# Patient Record
Sex: Male | Born: 1995 | Race: Black or African American | Hispanic: No | Marital: Single | Smoking: Current some day smoker
Health system: Southern US, Community
[De-identification: ages and names within clinical notes are randomized; demographics above are authoritative.]

---
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
## 2013-07-20 ENCOUNTER — Emergency Department (HOSPITAL_COMMUNITY)
Admission: EM | Admit: 2013-07-20 | Discharge: 2013-07-20 | Discharge status: Absent without leave | Payer: Self-pay | Source: Home / Self Care

## 2017-07-10 ENCOUNTER — Other Ambulatory Visit: Payer: Self-pay

## 2017-07-10 ENCOUNTER — Emergency Department (HOSPITAL_COMMUNITY)
Admission: EM | Admit: 2017-07-10 | Discharge: 2017-07-10 | Disposition: A | Discharge status: Home / Self Care | Payer: Self-pay | Attending: Emergency Medicine | Admitting: Emergency Medicine

## 2017-07-10 ENCOUNTER — Emergency Department (HOSPITAL_COMMUNITY): Payer: Self-pay

## 2017-07-10 ENCOUNTER — Encounter (HOSPITAL_COMMUNITY): Payer: Self-pay | Admitting: Emergency Medicine

## 2017-07-10 DIAGNOSIS — R070 Pain in throat: Secondary | ICD-10-CM | POA: Insufficient documentation

## 2017-07-10 DIAGNOSIS — R0981 Nasal congestion: Secondary | ICD-10-CM | POA: Insufficient documentation

## 2017-07-10 DIAGNOSIS — F17228 Nicotine dependence, chewing tobacco, with other nicotine-induced disorders: Secondary | ICD-10-CM | POA: Insufficient documentation

## 2017-07-10 DIAGNOSIS — F17200 Nicotine dependence, unspecified, uncomplicated: Secondary | ICD-10-CM | POA: Insufficient documentation

## 2017-07-10 DIAGNOSIS — J069 Acute upper respiratory infection, unspecified: Secondary | ICD-10-CM | POA: Insufficient documentation

## 2017-07-10 DIAGNOSIS — R0602 Shortness of breath: Secondary | ICD-10-CM | POA: Insufficient documentation

## 2017-07-10 DIAGNOSIS — F1210 Cannabis abuse, uncomplicated: Secondary | ICD-10-CM | POA: Insufficient documentation

## 2017-07-10 MED ORDER — AZITHROMYCIN 250 MG PO TABS: 250.0000 mg | ORAL_TABLET | Freq: Every day | ORAL | 0 refills | Status: AC

## 2017-07-10 MED ORDER — LORATADINE 10 MG PO TABS: 10.0000 mg | ORAL_TABLET | Freq: Every day | ORAL | 0 refills | Status: AC

## 2017-07-10 NOTE — ED Provider Notes (Signed)
[facility] EMERGENCY DEPARTMENT Provider Note   CSN: [id number] Arrival date & time: 07/10/17  1355     History   Chief Complaint Chief Complaint  Patient presents with  . Cough  . Shortness of Breath  . Nasal Congestion    HPI Norman Bennett is a 22 y.o. male presenting for evaluation of cough, sore throat, nasal congestion.  Patient states for the past 3 weeks, he has had URI symptoms.  It began with a dry cough, he then developed nasal congestion, and currently has a sore throat.  His cough is now productive with phlegm.  He reports chills last night, no fevers.  He has been taking over-the-counter cold and flu medicine without improvement of symptoms.  He smokes cigarettes and smokes marijuana, but has decreased due to his symptoms.  He denies diagnosed history of asthma.  Multiple sick family members at home including child with AOM and mom with walking pneumonia.  He has no medical problems, does not take medications daily.  He denies ear pain, eye pain, difficulty breathing, chest pain, shortness of breath, nausea, vomiting, abdominal pain, urinary symptoms, abnormal bowel movements.  HPI  History reviewed. No pertinent past medical history.  There are no active problems to display for this patient.   History reviewed. No pertinent surgical history.      Home Medications    Prior to Admission medications   Medication Sig Start Date End Date Taking? Authorizing Provider  azithromycin (ZITHROMAX) 250 MG tablet Take 1 tablet (250 mg total) by mouth daily. Take first 2 tablets together, then 1 every day until finished. 07/10/17   Verlisa Vara, PA-C  loratadine (CLARITIN) 10 MG tablet Take 1 tablet (10 mg total) by mouth daily. 07/10/17   Garnett Nunziata, PA-C    Family History No family history on file.  Social History Social History   Tobacco Use  . Smoking status: Current Every Day Smoker  . Smokeless tobacco: Current User  Substance  Use Topics  . Alcohol use: Yes  . Drug use: Yes    Types: Marijuana     Allergies   Patient has no allergy information on record.   Review of Systems Review of Systems  Constitutional: Negative for fever.  HENT: Positive for congestion.   Respiratory: Positive for cough. Negative for shortness of breath.   Cardiovascular: Negative for chest pain.     Physical Exam Updated Vital Signs BP 116/62 (BP Location: Right Arm)   Pulse 95   Temp 98.2 F (36.8 C) (Oral)   Resp 18   Ht  (1.727 m)   Wt 59 kg (130 lb)   SpO2 100%   BMI 19.77 kg/m   Physical Exam  Constitutional: He is oriented to person, place, and time. He appears well-developed and well-nourished. No distress.  Resting comfortably in bed in no apparent distress  HENT:  Head: Normocephalic and atraumatic.  Right Ear: Tympanic membrane, external ear and ear canal normal.  Left Ear: Tympanic membrane, external ear and ear canal normal.  Nose: Mucosal edema present. Right sinus exhibits no maxillary sinus tenderness and no frontal sinus tenderness. Left sinus exhibits no maxillary sinus tenderness and no frontal sinus tenderness.  Mouth/Throat: Uvula is midline, oropharynx is clear and moist and mucous membranes are normal. No tonsillar exudate.  OP clear without tonsillar swelling or exudate.  Uvula midline with equal palate rise.  TMs nonerythematous bilaterally.  Nasal mucosal edema.  Eyes: Pupils are equal, round, and reactive  to light. Conjunctivae and EOM are normal.  Neck: Normal range of motion.  Cardiovascular: Normal rate, regular rhythm and intact distal pulses.  Pulmonary/Chest: Effort normal and breath sounds normal. He has no decreased breath sounds. He has no wheezes. He has no rhonchi. He has no rales.  Pt speaking in full sentences without difficulty.  Clear lung sounds in all fields  Abdominal: Soft. He exhibits no distension. There is no tenderness.  Musculoskeletal: Normal range of motion.    Lymphadenopathy:    He has no cervical adenopathy.  Neurological: He is alert and oriented to person, place, and time.  Skin: Skin is warm.  Psychiatric: He has a normal mood and affect.  Nursing note and vitals reviewed.    ED Treatments / Results  Labs (all labs ordered are listed, but only abnormal results are displayed) Labs Reviewed - No data to display  EKG None  Radiology Dg Chest 2 View  Result Date: 07/10/2017 CLINICAL DATA:  Cough, congestion and shortness of breath for 3 weeks. EXAM: CHEST - 2 VIEW COMPARISON:  None. FINDINGS: Cardiomediastinal silhouette is normal. Mediastinal contours appear intact. There is no evidence of focal airspace consolidation, pleural effusion or pneumothorax. Osseous structures are without acute abnormality. Soft tissues are grossly normal. IMPRESSION: No active cardiopulmonary disease. Electronically Signed   By: Ted Mcalpine M.D.   On: 07/10/2017 17:56    Procedures Procedures (including critical care time)  Medications Ordered in ED Medications - No data to display   Initial Impression / Assessment and Plan / ED Course  I have reviewed the triage vital signs and the nursing notes.  Pertinent labs & imaging results that were available during my care of the patient were reviewed by me and considered in my medical decision making (see chart for details).     Patient presenting with 3 wk h/o URI symptoms.  Physical exam reassuring, patient is afebrile and appears nontoxic.  Pulmonary exam reassuring.  Doubt pneumonia, strep, other bacterial infection, or peritonsillar abscess. However, tp with 3 wks of sxs and contact with walking PNA, will obtain cxr.   CXR viewed and interpreted by me, no PNA, PNX, or effusions.  Likely URI.  Will treat symptomatically and with abx due to duration.  Patient to follow-up as needed.  At this time, patient appears safe for discharge.  Return precautions given.  Patient states he understands and  agrees to plan.   Final Clinical Impressions(s) / ED Diagnoses   Final diagnoses:  Upper respiratory tract infection, unspecified type    ED Discharge Orders        Ordered    azithromycin (ZITHROMAX) 250 MG tablet  Daily     07/10/17 1819    loratadine (CLARITIN) 10 MG tablet  Daily     07/10/17 1819       Alveria Apley, PA-C 07/10/17 1833    Rolland Porter, MD 07/21/17 1151

## 2017-07-10 NOTE — Discharge Instructions (Addendum)
Take azithromycin (antibiotic) as prescribed. Take the entire course, even if your symptoms improve.  Use loratadine daily. Use Tylenol or ibuprofen as needed for fevers or body aches. Use Flonase daily for nasal congestion and cough. Make sure you stay well-hydrated with water. Wash your hands frequently to prevent spread of infection. Return to the emergency room if you develop persistent high fevers, difficulty breathing/swallowing, or any new or worsening symptoms.

## 2017-07-10 NOTE — ED Triage Notes (Signed)
Pt. Stated, Norman Bennett had a cough, congested and SOB since May 9.

## 2018-05-18 DIAGNOSIS — R369 Urethral discharge, unspecified: Secondary | ICD-10-CM | POA: Insufficient documentation

## 2018-05-18 DIAGNOSIS — F17210 Nicotine dependence, cigarettes, uncomplicated: Secondary | ICD-10-CM | POA: Insufficient documentation

## 2018-05-18 DIAGNOSIS — Z79899 Other long term (current) drug therapy: Secondary | ICD-10-CM | POA: Insufficient documentation

## 2018-05-19 ENCOUNTER — Emergency Department (HOSPITAL_COMMUNITY)
Admission: EM | Admit: 2018-05-19 | Discharge: 2018-05-19 | Disposition: A | Discharge status: Home / Self Care | Payer: Medicaid Other | Attending: Emergency Medicine | Admitting: Emergency Medicine

## 2018-05-19 ENCOUNTER — Encounter (HOSPITAL_COMMUNITY): Payer: Self-pay

## 2018-05-19 DIAGNOSIS — R369 Urethral discharge, unspecified: Secondary | ICD-10-CM

## 2018-05-19 LAB — GC/CHLAMYDIA PROBE AMP (~~LOC~~) NOT AT ARMC
Chlamydia: NEGATIVE
Neisseria Gonorrhea: POSITIVE — AB

## 2018-05-19 MED ORDER — STERILE WATER FOR INJECTION IJ SOLN
INTRAMUSCULAR | Status: AC
  Administered 2018-05-19: 01:00:00 1 mL
  Filled 2018-05-19: qty 10

## 2018-05-19 MED ORDER — CEFTRIAXONE SODIUM 250 MG IJ SOLR
250.0000 mg | Freq: Once | INTRAMUSCULAR | Status: AC
  Administered 2018-05-19: 250 mg via INTRAMUSCULAR
  Filled 2018-05-19: qty 250

## 2018-05-19 MED ORDER — AZITHROMYCIN 250 MG PO TABS
1000.0000 mg | ORAL_TABLET | Freq: Once | ORAL | Status: AC
  Administered 2018-05-19: 1000 mg via ORAL
  Filled 2018-05-19: qty 4

## 2018-05-19 NOTE — Discharge Instructions (Signed)
You will be notified of culture results in the next 48-72 hours.   Should remain abstinent for at least a week, partners should be treated as well.   Can follow-up at the health dept.  Return here for any new/acute changes.

## 2018-05-19 NOTE — ED Triage Notes (Signed)
Pt reports green penile discharge starting earlier today. Denies pain.

## 2018-05-19 NOTE — ED Provider Notes (Signed)
[facility]   CSN: [id number] Arrival date & time: 05/18/18  2355    History   Chief Complaint Chief Complaint  Patient presents with  . Penile Discharge    HPI Norman Bennett is a 23 y.o. male.     The history is provided by medical records and the patient.  Penile Discharge     23 year old male here with penile discharge.  Just noticed this today.  Discharge is yellow in color.  States he did notify his sexual partner but they report they are not having any symptoms.  Does have history of STD in the past with similar symptoms.  Has known dysuria, hematuria, fever, chills, or rash.  No genital lesions.  History reviewed. No pertinent past medical history.  There are no active problems to display for this patient.   History reviewed. No pertinent surgical history.      Home Medications    Prior to Admission medications   Medication Sig Start Date End Date Taking? Authorizing Provider  azithromycin (ZITHROMAX) 250 MG tablet Take 1 tablet (250 mg total) by mouth daily. Take first 2 tablets together, then 1 every day until finished. 07/10/17   Caccavale, Sophia, PA-C  loratadine (CLARITIN) 10 MG tablet Take 1 tablet (10 mg total) by mouth daily. 07/10/17   Caccavale, Sophia, PA-C    Family History History reviewed. No pertinent family history.  Social History Social History   Tobacco Use  . Smoking status: Current Every Day Smoker  . Smokeless tobacco: Current User  Substance Use Topics  . Alcohol use: Yes  . Drug use: Yes    Types: Marijuana     Allergies   Patient has no known allergies.   Review of Systems Review of Systems  Genitourinary: Positive for discharge.  All other systems reviewed and are negative.    Physical Exam Updated Vital Signs BP 120/68 (BP Location: Right Arm)   Pulse 68   Temp 98.8 F (37.1 C) (Oral)   Resp 16   Ht 5\' 9"  (1.753 m)   Wt 59 kg   SpO2 99%   BMI 19.20  kg/m   Physical Exam Vitals signs and nursing note reviewed.  Constitutional:      Appearance: He is well-developed.  HENT:     Head: Normocephalic and atraumatic.  Eyes:     Conjunctiva/sclera: Conjunctivae normal.     Pupils: Pupils are equal, round, and reactive to light.  Neck:     Musculoskeletal: Normal range of motion.  Cardiovascular:     Rate and Rhythm: Normal rate and regular rhythm.     Heart sounds: Normal heart sounds.  Pulmonary:     Effort: Pulmonary effort is normal.     Breath sounds: Normal breath sounds.  Abdominal:     General: Bowel sounds are normal.     Palpations: Abdomen is soft.  Genitourinary:    Penis: Circumcised. Discharge present.      Comments: Small amount of thin, watery yellow discharge at urethral meatus, GU exam otherwise normal Musculoskeletal: Normal range of motion.  Skin:    General: Skin is warm and dry.  Neurological:     Mental Status: He is alert and oriented to person, place, and time.      ED Treatments / Results  Labs (all labs ordered are listed, but only abnormal results are displayed) Labs Reviewed  GC/CHLAMYDIA PROBE AMP ([place]) NOT AT [facility]    EKG None  Radiology  No results found.  Procedures Procedures (including critical care time)  Medications Ordered in ED Medications  sterile water (preservative free) injection (has no administration in time range)  azithromycin (ZITHROMAX) tablet 1,000 mg (1,000 mg Oral Given 05/19/18 0048)  cefTRIAXone (ROCEPHIN) injection 250 mg (250 mg Intramuscular Given 05/19/18 0048)     Initial Impression / Assessment and Plan / ED Course  I have reviewed the triage vital signs and the nursing notes.  Pertinent labs & imaging results that were available during my care of the patient were reviewed by me and considered in my medical decision making (see chart for details).  23 y.o. M here with penile discharge.  Hx of STD in the past.  No dysuria, hematuria, abdominal  pain, flank pain, genital lesions, or rash.  Does have some discharged noted on exam at urethral meatus.  Gc/chl pending.  Given rocephin/azithromycin here.  He will be notified of culture results in the next 48-72 hours.  Should remain abstinent for at least a week, partners should be treated as well.  Can follow-up at the health dept.  Return here for new concerns.  Final Clinical Impressions(s) / ED Diagnoses   Final diagnoses:  Penile discharge    ED Discharge Orders    None       Garlon Hatchet, PA-C 05/19/18 0056    Paula Libra, MD 05/19/18 [phone]

## 2018-10-03 IMAGING — CR DG CHEST 2V
2 series · 2 of 2 positions shown · non-contrast
Comparison: None.

CLINICAL DATA: Cough, congestion and shortness of breath for 3
weeks.

EXAM:
CHEST - 2 VIEW

[chest pa]
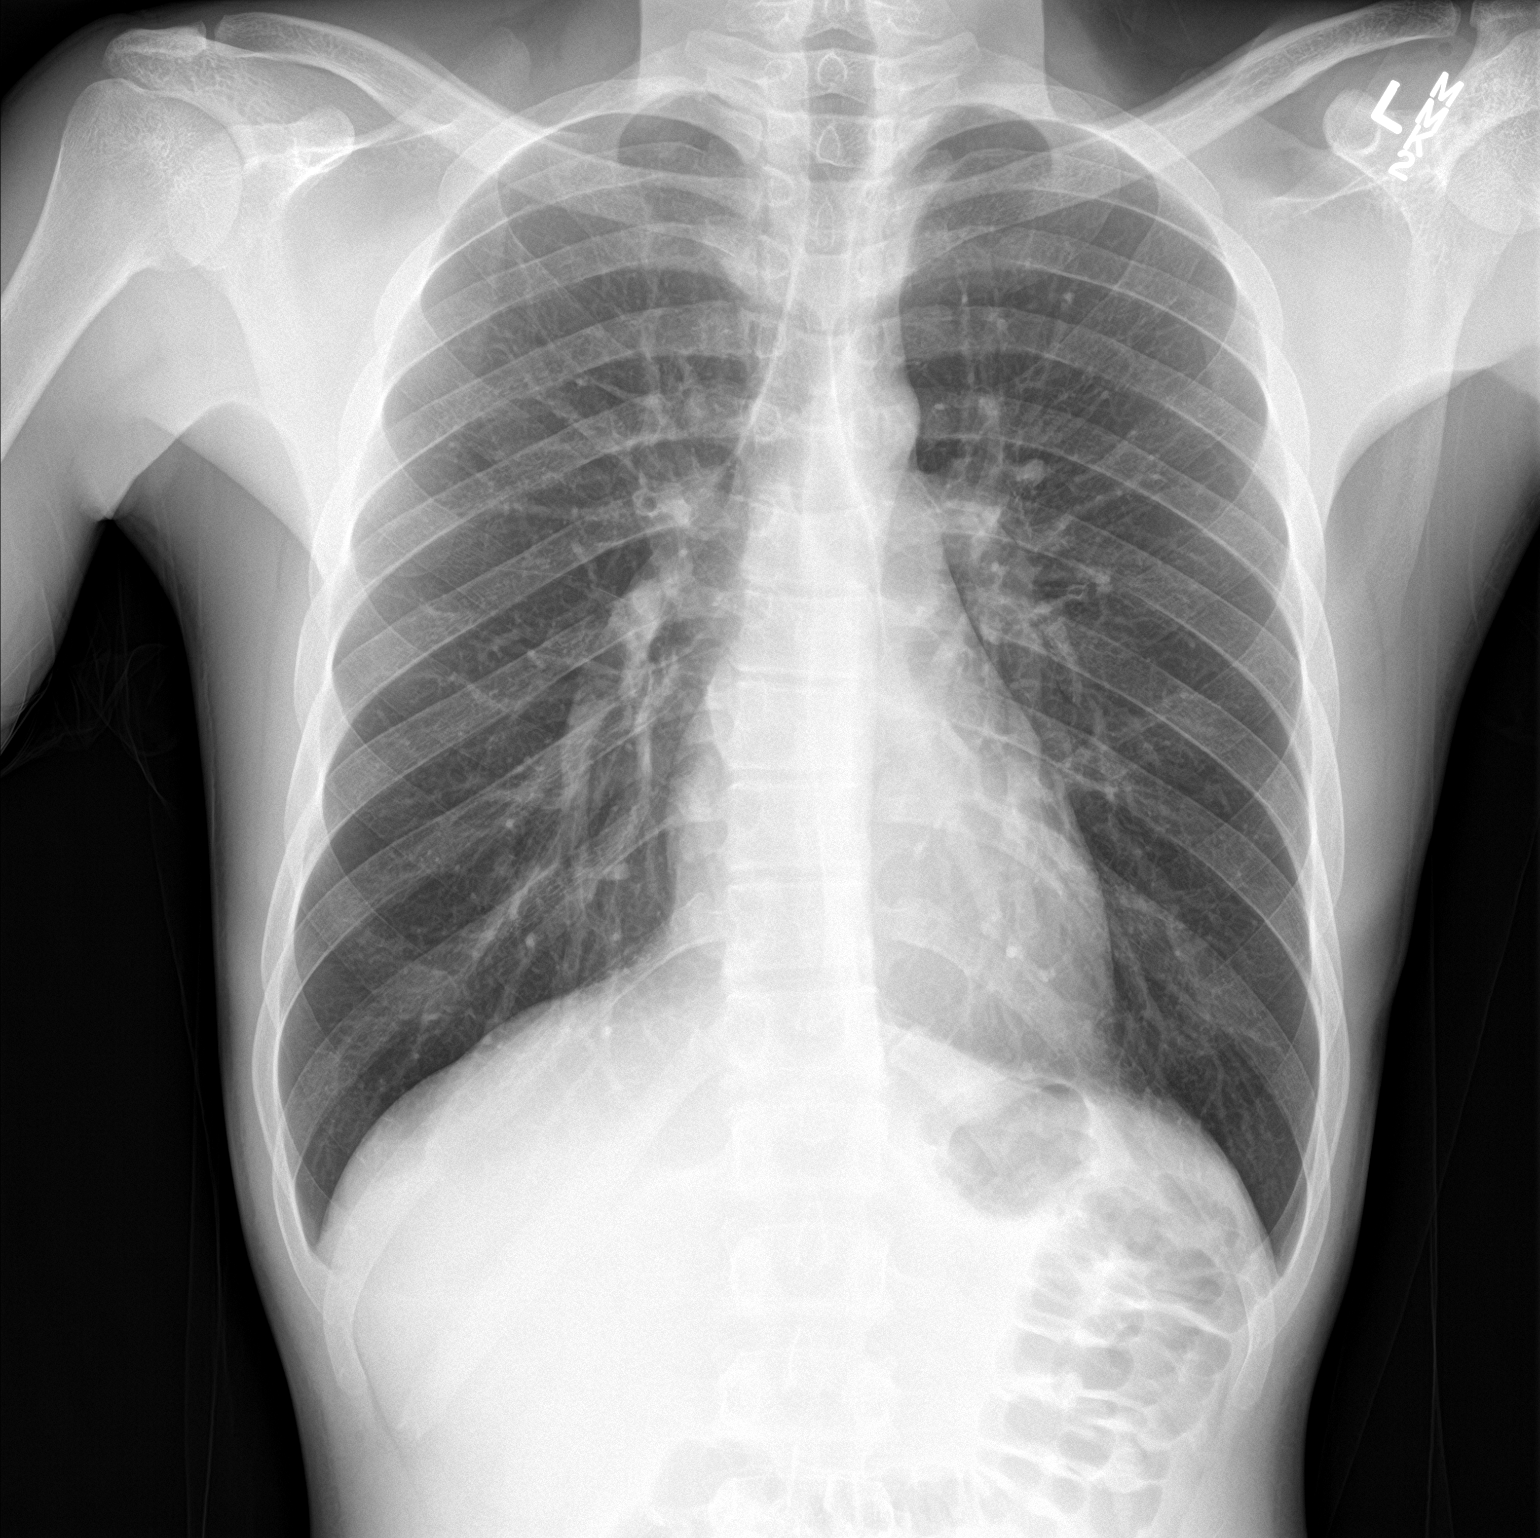

[chest lat]
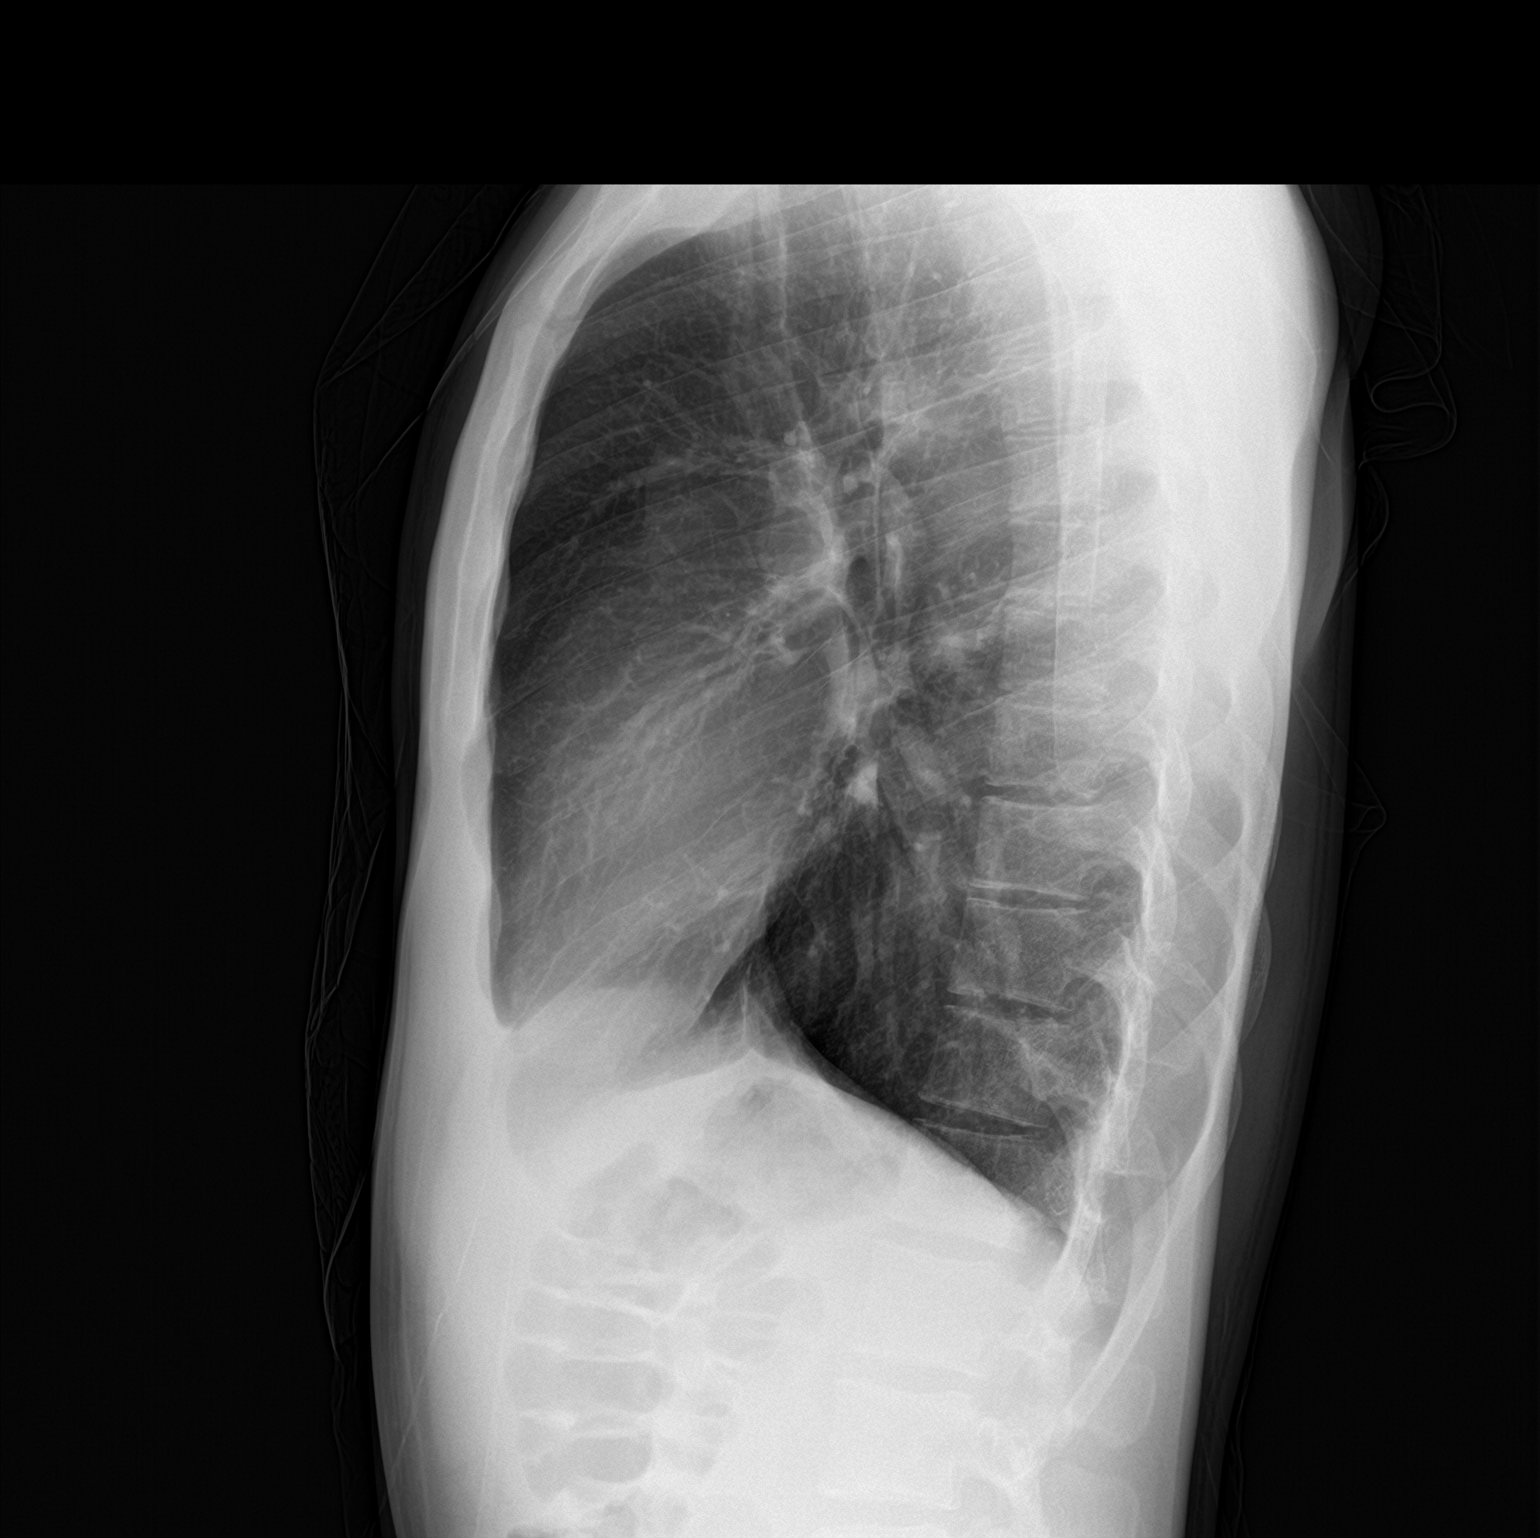

[2 of 2 positions shown; findings below may reference images not displayed]

FINDINGS: Cardiomediastinal silhouette is normal. Mediastinal contours appear
intact.

There is no evidence of focal airspace consolidation, pleural
effusion or pneumothorax.

Osseous structures are without acute abnormality. Soft tissues are
grossly normal.
IMPRESSION: No active cardiopulmonary disease.

## 2019-01-29 ENCOUNTER — Encounter (HOSPITAL_COMMUNITY): Payer: Self-pay

## 2019-01-29 ENCOUNTER — Other Ambulatory Visit: Payer: Self-pay

## 2019-01-29 ENCOUNTER — Emergency Department (HOSPITAL_COMMUNITY)
Admission: EM | Admit: 2019-01-29 | Discharge: 2019-01-29 | Discharge status: Against Medical Advice | Payer: Medicaid Other | Attending: Emergency Medicine | Admitting: Emergency Medicine

## 2019-01-29 DIAGNOSIS — Z202 Contact with and (suspected) exposure to infections with a predominantly sexual mode of transmission: Secondary | ICD-10-CM | POA: Insufficient documentation

## 2019-01-29 DIAGNOSIS — Z5321 Procedure and treatment not carried out due to patient leaving prior to being seen by health care provider: Secondary | ICD-10-CM | POA: Insufficient documentation

## 2019-01-29 NOTE — ED Triage Notes (Signed)
Pt reports his ex girlfriend started having symptoms of an STD yesterday. Pt denies any symptoms himself but would like to be tested for STDs just in case.

## 2019-07-27 ENCOUNTER — Other Ambulatory Visit: Payer: Self-pay

## 2019-07-27 ENCOUNTER — Emergency Department (HOSPITAL_COMMUNITY): Payer: Self-pay

## 2019-07-27 ENCOUNTER — Emergency Department (HOSPITAL_COMMUNITY)
Admission: EM | Admit: 2019-07-27 | Discharge: 2019-07-27 | Disposition: A | Discharge status: Home / Self Care | Payer: Self-pay | Attending: Emergency Medicine | Admitting: Emergency Medicine

## 2019-07-27 ENCOUNTER — Encounter (HOSPITAL_COMMUNITY): Payer: Self-pay

## 2019-07-27 DIAGNOSIS — F17290 Nicotine dependence, other tobacco product, uncomplicated: Secondary | ICD-10-CM | POA: Insufficient documentation

## 2019-07-27 DIAGNOSIS — R519 Headache, unspecified: Secondary | ICD-10-CM | POA: Insufficient documentation

## 2019-07-27 DIAGNOSIS — R110 Nausea: Secondary | ICD-10-CM | POA: Insufficient documentation

## 2019-07-27 DIAGNOSIS — J069 Acute upper respiratory infection, unspecified: Secondary | ICD-10-CM | POA: Insufficient documentation

## 2019-07-27 DIAGNOSIS — Z20822 Contact with and (suspected) exposure to covid-19: Secondary | ICD-10-CM | POA: Insufficient documentation

## 2019-07-27 LAB — SARS CORONAVIRUS 2 BY RT PCR (HOSPITAL ORDER, PERFORMED IN ~~LOC~~ HOSPITAL LAB): SARS Coronavirus 2: NEGATIVE

## 2019-07-27 MED ORDER — IBUPROFEN 200 MG PO TABS
600.0000 mg | ORAL_TABLET | Freq: Once | ORAL | Status: AC
  Administered 2019-07-27: 600 mg via ORAL
  Filled 2019-07-27: qty 3

## 2019-07-27 MED ORDER — PROCHLORPERAZINE MALEATE 10 MG PO TABS
10.0000 mg | ORAL_TABLET | Freq: Once | ORAL | Status: AC
  Administered 2019-07-27: 10 mg via ORAL
  Filled 2019-07-27: qty 1

## 2019-07-27 MED ORDER — DIPHENHYDRAMINE HCL 25 MG PO CAPS
25.0000 mg | ORAL_CAPSULE | Freq: Once | ORAL | Status: AC
  Administered 2019-07-27: 25 mg via ORAL
  Filled 2019-07-27: qty 1

## 2019-07-27 NOTE — ED Triage Notes (Signed)
Pt reports a fever since last night. Took some Coricidin earlier without relief. No fever in triage. Does report eye pain, especially when he moves his eyes.

## 2019-07-27 NOTE — ED Notes (Signed)
Pt declined dc vitals.

## 2019-07-27 NOTE — ED Provider Notes (Signed)
[facility] DEPT Provider Note   CSN: [id number] Arrival date & time: 07/27/19  2105     History Chief Complaint  Patient presents with  . Fever    Norman Bennett is a 24 y.o. male with no significant past medical history who presents to the emergency department with a chief complaint of fever.  The patient endorses a fever, onset 24 hours ago.  T-max 101.  He treated his symptoms with 2 tablets of Coricidin at 1300.  He has associated sinus pain and pressure to his bilateral forehead and behind his bilateral eyes, productive cough with green sputum, nasal congestion, and rhinorrhea, onset yesterday.  He reports his headache and eye pain is worse when he looks upward.  States that last night he had difficulty concentrating while he was playing video games, but otherwise has not had any blurred vision..  Reports that he has had a poor appetite for most of the day and has been having nausea.  He denies chills, neck pain or stiffness, shortness of breath, chest pain, vomiting, diarrhea, abdominal pain, loss of sense of taste or smell, otalgia, sore throat, numbness, weakness, photophobia, eye redness or purulent drainage, facial swelling, diplopia, amaurosis fugax.   He does note that he vapes some tobacco and marijuana earlier in the week and has noticed some intermittent coughing.  Otherwise, he does not have daily tobacco or marijuana use.  He has not received his COVID-19 vaccine.  Reports that he was around his daughter yesterday who was coughing.  No other known sick contacts.  He has no chronic medical conditions and takes no daily medications.  Norman Bennett was evaluated in Emergency Department on 07/27/2019 for the symptoms described in the history of present illness. He was evaluated in the context of the global COVID-19 pandemic, which necessitated consideration that the patient might be at risk for infection with the SARS-CoV-2 virus that causes  COVID-19. Institutional protocols and algorithms that pertain to the evaluation of patients at risk for COVID-19 are in a state of rapid change based on information released by regulatory bodies including the CDC and federal and state organizations. These policies and algorithms were followed during the patient's care in the ED.   The history is provided by the patient. No language interpreter was used.       History reviewed. No pertinent past medical history.  There are no problems to display for this patient.   History reviewed. No pertinent surgical history.     History reviewed. No pertinent family history.  Social History   Tobacco Use  . Smoking status: Current Every Day Smoker  . Smokeless tobacco: Current User  Substance Use Topics  . Alcohol use: Yes  . Drug use: Yes    Types: Marijuana    Home Medications Prior to Admission medications   Medication Sig Start Date End Date Taking? Authorizing Provider  azithromycin (ZITHROMAX) 250 MG tablet Take 1 tablet (250 mg total) by mouth daily. Take first 2 tablets together, then 1 every day until finished. 07/10/17   Caccavale, Sophia, PA-C  loratadine (CLARITIN) 10 MG tablet Take 1 tablet (10 mg total) by mouth daily. 07/10/17   Caccavale, Sophia, PA-C    Allergies    Patient has no known allergies.  Review of Systems   Review of Systems  Constitutional: Positive for fever. Negative for appetite change and chills.  HENT: Positive for congestion, rhinorrhea, sinus pressure and sinus pain. Negative for ear discharge, ear pain,  facial swelling, hearing loss, sore throat and trouble swallowing.   Eyes: Positive for pain. Negative for photophobia, discharge, itching and visual disturbance.  Respiratory: Positive for cough. Negative for shortness of breath.   Cardiovascular: Negative for chest pain, palpitations and leg swelling.  Gastrointestinal: Positive for nausea. Negative for abdominal pain, constipation, diarrhea and  vomiting.  Genitourinary: Negative for dysuria.  Musculoskeletal: Negative for arthralgias, back pain, myalgias, neck pain and neck stiffness.  Skin: Negative for rash.  Allergic/Immunologic: Negative for immunocompromised state.  Neurological: Positive for headaches. Negative for dizziness, seizures, syncope, weakness and numbness.  Psychiatric/Behavioral: Negative for confusion.    Physical Exam Updated Vital Signs BP 107/73 (BP Location: Right Arm)   Pulse 64   Temp 98.6 F (37 C) (Oral)   Resp 16   Ht 5\' 8"  (1.727 m)   Wt 54.4 kg   SpO2 98%   BMI 18.25 kg/m   Physical Exam Vitals and nursing note reviewed.  Constitutional:      Appearance: He is well-developed.     Comments: Well-appearing.  No acute distress.  HENT:     Head: Normocephalic.     Right Ear: Tympanic membrane, ear canal and external ear normal.     Left Ear: Tympanic membrane, ear canal and external ear normal.     Nose: Congestion and rhinorrhea present.     Comments: Tender to palpation over the bilateral frontal sinuses, right greater than left.  No maxillary sinus tenderness.  No mastoid tenderness bilaterally.  Posterior oropharynx is minimally erythematous.  No exudate.  No tonsillar edema, erythema, or exudate.  Uvula is midline. Eyes:     General:        Right eye: No discharge.        Left eye: No discharge.     Extraocular Movements: Extraocular movements intact.     Conjunctiva/sclera: Conjunctivae normal.     Pupils: Pupils are equal, round, and reactive to light.     Comments: No periorbital erythema or edema.  Neck:     Comments: No meningismus. Cardiovascular:     Rate and Rhythm: Normal rate and regular rhythm.     Heart sounds: No murmur heard.   Pulmonary:     Effort: Pulmonary effort is normal.     Comments: Lungs are clear to auscultation bilaterally without increased work of breathing. Abdominal:     General: There is no distension.     Palpations: Abdomen is soft.      Tenderness: There is no abdominal tenderness.  Musculoskeletal:     Cervical back: Neck supple.     Right lower leg: No edema.     Left lower leg: No edema.  Lymphadenopathy:     Cervical: Cervical adenopathy present.  Skin:    General: Skin is warm and dry.  Neurological:     Mental Status: He is alert.  Psychiatric:        Behavior: Behavior normal.     ED Results / Procedures / Treatments   Labs (all labs ordered are listed, but only abnormal results are displayed) Labs Reviewed  SARS CORONAVIRUS 2 BY RT PCR (HOSPITAL ORDER, PERFORMED IN [facility])    EKG None  Radiology DG Chest Portable 1 View  Result Date: 07/27/2019 CLINICAL DATA:  Productive cough and fever EXAM: PORTABLE CHEST 1 VIEW COMPARISON:  Jul 10, 2017. FINDINGS: The heart size and mediastinal contours are within normal limits. Both lungs are clear. The visualized skeletal structures are unremarkable.  IMPRESSION: No active disease. Electronically Signed   By: Katherine Mantle M.D.   On: 07/27/2019 22:40    Procedures Procedures (including critical care time)  Medications Ordered in ED Medications  prochlorperazine (COMPAZINE) tablet 10 mg (10 mg Oral Given 07/27/19 2235)  ibuprofen (ADVIL) tablet 600 mg (600 mg Oral Given 07/27/19 2235)  diphenhydrAMINE (BENADRYL) capsule 25 mg (25 mg Oral Given 07/27/19 2235)    ED Course  I have reviewed the triage vital signs and the nursing notes.  Pertinent labs & imaging results that were available during my care of the patient were reviewed by me and considered in my medical decision making (see chart for details).    MDM Rules/Calculators/A&P                          24 year old otherwise healthy male presenting with URI symptoms for 24 hours.  He has not received his COVID-19 vaccine.  Reports that he spent yesterday with his young daughter who was also ill with a cough.  He does note that he has been having a productive cough with green sputum  in the setting of a fever to assess for bacterial pneumonia although his symptoms are most likely viral given the chronicity.  He is endorsing some eye pain, but on exam this is likely secondary to tenderness over his bilateral frontal sinuses.  I have a very low suspicion for septal or preseptal cellulitis.  His ocular exam is unremarkable.  No evidence of conjunctivitis.  Low suspicion for keratitis or uveitis based on his associated symptoms and exam.   I have a low suspicion for meningitis, streptococcal pharyngitis. We will also order COVID-19 test as he has not been vaccinated.  We will treat his sinus pain and pressure in the ER with an oral migraine cocktail as he is sitting in the room with the lights off due to the intensity of his headache and plan for discharge to home.   Chest x-ray has been reviewed by me.  No acute findings, including infiltrate or consolidation.  Patient is feeling much better after medication.  COVID-19 test is pending.  He is aware that if test is positive he will need to quarantine for a total of 10 days.  Suspect his symptoms are most likely due to viral process.  ER return precautions given.  He is hemodynamically stable and in no acute distress.  Safe for discharge home with outpatient follow-up as needed.   Final Clinical Impression(s) / ED Diagnoses Final diagnoses:  Viral URI with cough    Rx / DC Orders ED Discharge Orders    None       Naida Escalante, Coral Else, PA-C 07/27/19 2303    Cathren Laine, MD 07/29/19 1850

## 2019-07-27 NOTE — Discharge Instructions (Addendum)
Thank you for allowing me to care for you today in the Emergency Department.   Most likely your symptoms are due to a virus.  You do have a COVID-19 test that is pending.  You can check the results of this in my chart.  If the test is positive, you will need to quarantine at home for a total of 10 days.  Your work note reflects if this test is positive.  However, if it is negative, you may return to work tomorrow.  If the test is negative, your symptoms are most likely due to another virus.  You may take 600 mg of ibuprofen with food or 650 mg of Tylenol once every 6 hours as needed for headache or pain.  Please read the ingredients on Coricidin.  If it contains Tylenol/acetaminophen, make sure that you are not taking more than 4000 mg in a 24-hour period.   Make sure that you are drinking plenty of fluids to stay hydrated.  Return to the emergency department if you develop significant redness or swelling around your eyes, double vision, loss of vision in one or both eyes, if you become unable to move your neck, develop significant difficulty breathing, or other new, concerning symptoms.

## 2019-10-25 ENCOUNTER — Emergency Department (HOSPITAL_COMMUNITY)
Admission: EM | Admit: 2019-10-25 | Discharge: 2019-10-25 | Disposition: A | Discharge status: Home / Self Care | Payer: Medicaid Other | Attending: Emergency Medicine | Admitting: Emergency Medicine

## 2019-10-25 ENCOUNTER — Other Ambulatory Visit: Payer: Self-pay

## 2019-10-25 ENCOUNTER — Encounter (HOSPITAL_COMMUNITY): Payer: Self-pay

## 2019-10-25 DIAGNOSIS — F1590 Other stimulant use, unspecified, uncomplicated: Secondary | ICD-10-CM | POA: Insufficient documentation

## 2019-10-25 DIAGNOSIS — Z79899 Other long term (current) drug therapy: Secondary | ICD-10-CM | POA: Insufficient documentation

## 2019-10-25 DIAGNOSIS — Y939 Activity, unspecified: Secondary | ICD-10-CM | POA: Insufficient documentation

## 2019-10-25 DIAGNOSIS — X19XXXA Contact with other heat and hot substances, initial encounter: Secondary | ICD-10-CM | POA: Insufficient documentation

## 2019-10-25 DIAGNOSIS — Y929 Unspecified place or not applicable: Secondary | ICD-10-CM | POA: Insufficient documentation

## 2019-10-25 DIAGNOSIS — X58XXXA Exposure to other specified factors, initial encounter: Secondary | ICD-10-CM | POA: Insufficient documentation

## 2019-10-25 DIAGNOSIS — T22212A Burn of second degree of left forearm, initial encounter: Secondary | ICD-10-CM | POA: Insufficient documentation

## 2019-10-25 DIAGNOSIS — F17210 Nicotine dependence, cigarettes, uncomplicated: Secondary | ICD-10-CM | POA: Insufficient documentation

## 2019-10-25 DIAGNOSIS — Y999 Unspecified external cause status: Secondary | ICD-10-CM | POA: Insufficient documentation

## 2019-10-25 MED ORDER — CEPHALEXIN 250 MG PO CAPS: 250.0000 mg | ORAL_CAPSULE | Freq: Four times a day (QID) | ORAL | 0 refills | Status: AC

## 2019-10-25 NOTE — Discharge Instructions (Signed)
Please continue to apply bacitracin to the affected region on your arm.  You can purchase this cream at any pharmacy.  Also, I am prescribing you an antibiotic called Keflex.  You are going to take this 4 times a day for the next few days.  Please take this to help prevent infection in the region.  I recommend a combination of tylenol and ibuprofen for management of your pain. You can take a low dose of both at the same time. I recommend 325 mg of Tylenol combined with 400 mg of ibuprofen. This is one regular Tylenol and two regular ibuprofen. You can take these 2-3 times for day for your pain. Please try to take these medications with a small amount of food as well to prevent upsetting your stomach.  Please return to the ER with any new or worsening symptoms.  It was a pleasure to meet you.

## 2019-10-25 NOTE — ED Triage Notes (Signed)
Patient states he knocked over some grits 3-4 days ago onto his left forearm. Patient has been using triple antibiotic ointment and OTC burn cream . Patient has not been using non stick bandages and states that he keeps pulling the skin off.
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
## 2019-11-20 ENCOUNTER — Other Ambulatory Visit: Payer: Self-pay

## 2019-11-20 ENCOUNTER — Encounter (HOSPITAL_COMMUNITY): Payer: Self-pay

## 2019-11-20 ENCOUNTER — Emergency Department (HOSPITAL_COMMUNITY)
Admission: EM | Admit: 2019-11-20 | Discharge: 2019-11-20 | Disposition: A | Discharge status: Home / Self Care | Payer: Commercial Managed Care - PPO | Attending: Emergency Medicine | Admitting: Emergency Medicine

## 2019-11-20 DIAGNOSIS — Z202 Contact with and (suspected) exposure to infections with a predominantly sexual mode of transmission: Secondary | ICD-10-CM | POA: Insufficient documentation

## 2019-11-20 DIAGNOSIS — F17210 Nicotine dependence, cigarettes, uncomplicated: Secondary | ICD-10-CM | POA: Diagnosis not present

## 2019-11-20 LAB — RAPID HIV SCREEN (HIV 1/2 AB+AG)
HIV 1/2 Antibodies: NONREACTIVE
HIV-1 P24 Antigen - HIV24: NONREACTIVE

## 2019-11-20 MED ORDER — DOXYCYCLINE HYCLATE 100 MG PO TABS
100.0000 mg | ORAL_TABLET | Freq: Once | ORAL | Status: AC
  Administered 2019-11-20: 100 mg via ORAL
  Filled 2019-11-20: qty 1

## 2019-11-20 MED ORDER — LIDOCAINE HCL 1 % IJ SOLN
INTRAMUSCULAR | Status: AC
  Administered 2019-11-20: 20 mL
  Filled 2019-11-20: qty 20

## 2019-11-20 MED ORDER — CEFTRIAXONE SODIUM 1 G IJ SOLR
500.0000 mg | Freq: Once | INTRAMUSCULAR | Status: AC
  Administered 2019-11-20: 500 mg via INTRAMUSCULAR
  Filled 2019-11-20: qty 10

## 2019-11-20 MED ORDER — DOXYCYCLINE HYCLATE 100 MG PO CAPS: 100.0000 mg | ORAL_CAPSULE | Freq: Two times a day (BID) | ORAL | 0 refills | Status: AC

## 2019-11-20 NOTE — Discharge Instructions (Addendum)
We will contact you with the results of your testing if it is positive. Take the doxycycline for 1 week. Return to the ER if you start to experience fever, abdominal pain, testicle pain or swelling.
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
## 2019-11-20 NOTE — Progress Notes (Signed)
Called by lab with a non-reactive HIV result; ordering physician Dr. Dalene Seltzer notified.

## 2019-11-20 NOTE — ED Triage Notes (Signed)
Patient states he was exposed to Chylamydia.

## 2019-11-21 LAB — GC/CHLAMYDIA PROBE AMP (~~LOC~~) NOT AT ARMC
Chlamydia: POSITIVE — AB
Comment: NEGATIVE
Comment: NORMAL
Neisseria Gonorrhea: NEGATIVE

## 2019-11-21 LAB — RPR: RPR Ser Ql: NONREACTIVE

## 2020-03-01 ENCOUNTER — Other Ambulatory Visit: Payer: Self-pay

## 2020-03-01 ENCOUNTER — Emergency Department (HOSPITAL_COMMUNITY)
Admission: EM | Admit: 2020-03-01 | Discharge: 2020-03-01 | Disposition: A | Discharge status: Home / Self Care | Payer: HRSA Program | Attending: Emergency Medicine | Admitting: Emergency Medicine

## 2020-03-01 DIAGNOSIS — F17210 Nicotine dependence, cigarettes, uncomplicated: Secondary | ICD-10-CM | POA: Insufficient documentation

## 2020-03-01 DIAGNOSIS — U071 COVID-19: Secondary | ICD-10-CM | POA: Diagnosis not present

## 2020-03-01 DIAGNOSIS — R509 Fever, unspecified: Secondary | ICD-10-CM | POA: Diagnosis present

## 2020-03-01 LAB — RESP PANEL BY RT-PCR (FLU A&B, COVID) ARPGX2
Influenza A by PCR: NEGATIVE
Influenza B by PCR: NEGATIVE
SARS Coronavirus 2 by RT PCR: POSITIVE — AB

## 2020-03-01 MED ORDER — ACETAMINOPHEN 325 MG PO TABS
650.0000 mg | ORAL_TABLET | Freq: Once | ORAL | Status: AC
  Administered 2020-03-01: 650 mg via ORAL
  Filled 2020-03-01: qty 2

## 2020-03-01 NOTE — ED Triage Notes (Signed)
Presents with fever, shob, abdominal pain and nausea--onset yesterday. Denies COVID exposure.

## 2020-03-01 NOTE — ED Provider Notes (Signed)
[facility]   CSN: [id number] Arrival date & time: 03/01/20  7564     History Chief Complaint  Patient presents with  . Fever  . Covid Positive    Norman Bennett is a 25 y.o. male with no known past medical history. Did not have covid vaccinations.  HPI Patient presents to emergency department today with chief complaint of fever x1 day.  He states last night he had a temperature of 103.  He took ibuprofen and fever broke.  This morning he thinks the fever returned however did not check his temperature.  His last dose of Motrin was at 7 AM this morning.  He is also endorsing generalized body aches, abdominal pain, nausea and shortness of breath.  Symptoms have been intermittent.  He has been able to tolerate p.o. intake without difficulty.  He denies any known COVID exposures or sick contacts.  Denies chills, headache, congestion, cough, chest pain, nausea, emesis, urinary symptoms, diarrhea.    No past medical history on file.  There are no problems to display for this patient.   No past surgical history on file.     Family History  Problem Relation Age of Onset  . Multiple sclerosis Mother   . Heart failure Mother   . Pancreatitis Father     Social History   Tobacco Use  . Smoking status: Current Some Day Smoker    Types: Cigarettes  . Smokeless tobacco: Never Used  Vaping Use  . Vaping Use: Never used  Substance Use Topics  . Alcohol use: Yes  . Drug use: Yes    Types: Marijuana    Home Medications Prior to Admission medications   Medication Sig Start Date End Date Taking? Authorizing Provider  azithromycin (ZITHROMAX) 250 MG tablet Take 1 tablet (250 mg total) by mouth daily. Take first 2 tablets together, then 1 every day until finished. 07/10/17   Caccavale, Sophia, PA-C  loratadine (CLARITIN) 10 MG tablet Take 1 tablet (10 mg total) by mouth daily. 07/10/17   Caccavale, Sophia, PA-C    Allergies     Shellfish allergy  Review of Systems   Review of Systems All other systems are reviewed and are negative for acute change except as noted in the HPI.  Physical Exam Updated Vital Signs BP 106/65 (BP Location: Left Arm)   Pulse 71   Temp 100.3 F (37.9 C) (Oral)   Resp 16   SpO2 98%   Physical Exam Vitals and nursing note reviewed.  Constitutional:      General: He is not in acute distress.    Appearance: He is not ill-appearing.  HENT:     Head: Normocephalic and atraumatic.     Right Ear: Tympanic membrane and external ear normal.     Left Ear: Tympanic membrane and external ear normal.     Nose: Nose normal.     Mouth/Throat:     Mouth: Mucous membranes are moist.     Pharynx: Oropharynx is clear.  Eyes:     General: No scleral icterus.       Right eye: No discharge.        Left eye: No discharge.     Extraocular Movements: Extraocular movements intact.     Conjunctiva/sclera: Conjunctivae normal.     Pupils: Pupils are equal, round, and reactive to light.  Neck:     Vascular: No JVD.  Cardiovascular:     Rate and Rhythm: Normal rate and regular  rhythm.     Pulses: Normal pulses.          Radial pulses are 2+ on the right side and 2+ on the left side.     Heart sounds: Normal heart sounds.  Pulmonary:     Comments: Lungs clear to auscultation in all fields. Symmetric chest rise. No wheezing, rales, or rhonchi. Oxygen saturation 98% on room air. Abdominal:     General: There is no distension.     Palpations: Abdomen is soft.     Tenderness: There is no abdominal tenderness. There is no guarding or rebound.     Comments: Abdomen is soft, non-distended, and non-tender in all quadrants. No rigidity, no guarding. No peritoneal signs.  Musculoskeletal:        General: Normal range of motion.     Cervical back: Normal range of motion.  Skin:    General: Skin is warm and dry.     Capillary Refill: Capillary refill takes less than 2 seconds.  Neurological:      Mental Status: He is oriented to person, place, and time.     GCS: GCS eye subscore is 4. GCS verbal subscore is 5. GCS motor subscore is 6.     Comments: Fluent speech, no facial droop.  Psychiatric:        Behavior: Behavior normal.       ED Results / Procedures / Treatments   Labs (all labs ordered are listed, but only abnormal results are displayed) Labs Reviewed  RESP PANEL BY RT-PCR (FLU A&B, COVID) ARPGX2 - Abnormal; Notable for the following components:      Result Value   SARS Coronavirus 2 by RT PCR POSITIVE (*)    All other components within normal limits    EKG None  Radiology No results found.  Procedures Procedures (including critical care time)  Medications Ordered in ED Medications  acetaminophen (TYLENOL) tablet 650 mg (650 mg Oral Given 03/01/20 1452)    ED Course  I have reviewed the triage vital signs and the nursing notes.  Pertinent labs & imaging results that were available during my care of the patient were reviewed by me and considered in my medical decision making (see chart for details).    MDM Rules/Calculators/A&P                          History provided by patient with additional history obtained from chart review.    25 yo male ending with COVID-like symptoms.  Febrile in triage 100.4, hemodynamically stable, no tachycardia or hypoxia.  COVID test was collected in triage and resulted positive.  On my exam patient is well-appearing, in no acute distress.  Temperature is now 100.3, downtrending without antipyretic.  Lungs are clear to auscultation all fields he has normal work of breathing.  No abdominal tenderness.  No peritoneal signs.  Patient does not appear dehydrated.  He is drinking Powerade during exam. No indications for further emergent workup. Discussed symptomatic care and CDC quarantine guidelines. Strict return precautions   Norman Bennett was evaluated in Emergency Department on 03/01/2020 for the symptoms described in the  history of present illness. He was evaluated in the context of the global COVID-19 pandemic, which necessitated consideration that the patient might be at risk for infection with the SARS-CoV-2 virus that causes COVID-19. Institutional protocols and algorithms that pertain to the evaluation of patients at risk for COVID-19 are in a state of rapid change based  on information released by regulatory bodies including the CDC and federal and state organizations. These policies and algorithms were followed during the patient's care in the ED.   Portions of this note were generated with Scientist, clinical (histocompatibility and immunogenetics). Dictation errors may occur despite best attempts at proofreading.  Final Clinical Impression(s) / ED Diagnoses Final diagnoses:  COVID    Rx / DC Orders ED Discharge Orders    None       Kandice Hams 03/01/20 1508    Milagros Loll, MD 03/02/20 [phone]

## 2020-03-01 NOTE — Discharge Instructions (Addendum)
Thank you for allowing [place] to care for you today.   Please return to the emergency department if you have any new or worsening symptoms.  You tested positive for covid-19 today.   Medications- You can take medications to help treat your symptoms: -Tylenol for fever and body aches. Please take as prescribed on the bottle. -Over the coutner cough medicine such as mucinex, robitussin, or other brands. -Flonase or saline nasal spray for nasal congestion -Vitamins as recommended by CDC  Treatment- This is a virus and unfortunately there are no antibitotics approved to treat this virus at this time. It is important to monitor your symptoms closely: -You should have a theremometer at home to check your temperature when feeling feverish. -Use a pulse ox meter to measure your oxygen when feeling short of breath.  -If your fever is over 100.4 despite taking tylenol or if your oxygen level drops below 94% these are reasons to return to the emergency department for further evaluation.   -Quarantine guidelines: COVID-19 should isolate for 5 days if symptoms are resolving (without fever for 24 hours), follow that by 5 days of wearing a mask when around others to minimize the risk of infecting people they encounter.  -If still having fever  over 100.4 on day 5 you will need to quarantine to for another 5 days.   Again: symptoms of shortness of breath, chest pain, difficulty breathing, new onset of confusion, any symptoms that are concerning. If any of these symptoms you should come to emergency department for evaluation.   I hope you feel better soon

## 2020-10-19 IMAGING — DX DG CHEST 1V PORT
1 series · 1 of 1 positions shown · non-contrast
Comparison: July 10, 2017.

CLINICAL DATA: Productive cough and fever

EXAM:
PORTABLE CHEST 1 VIEW

[chest ap]
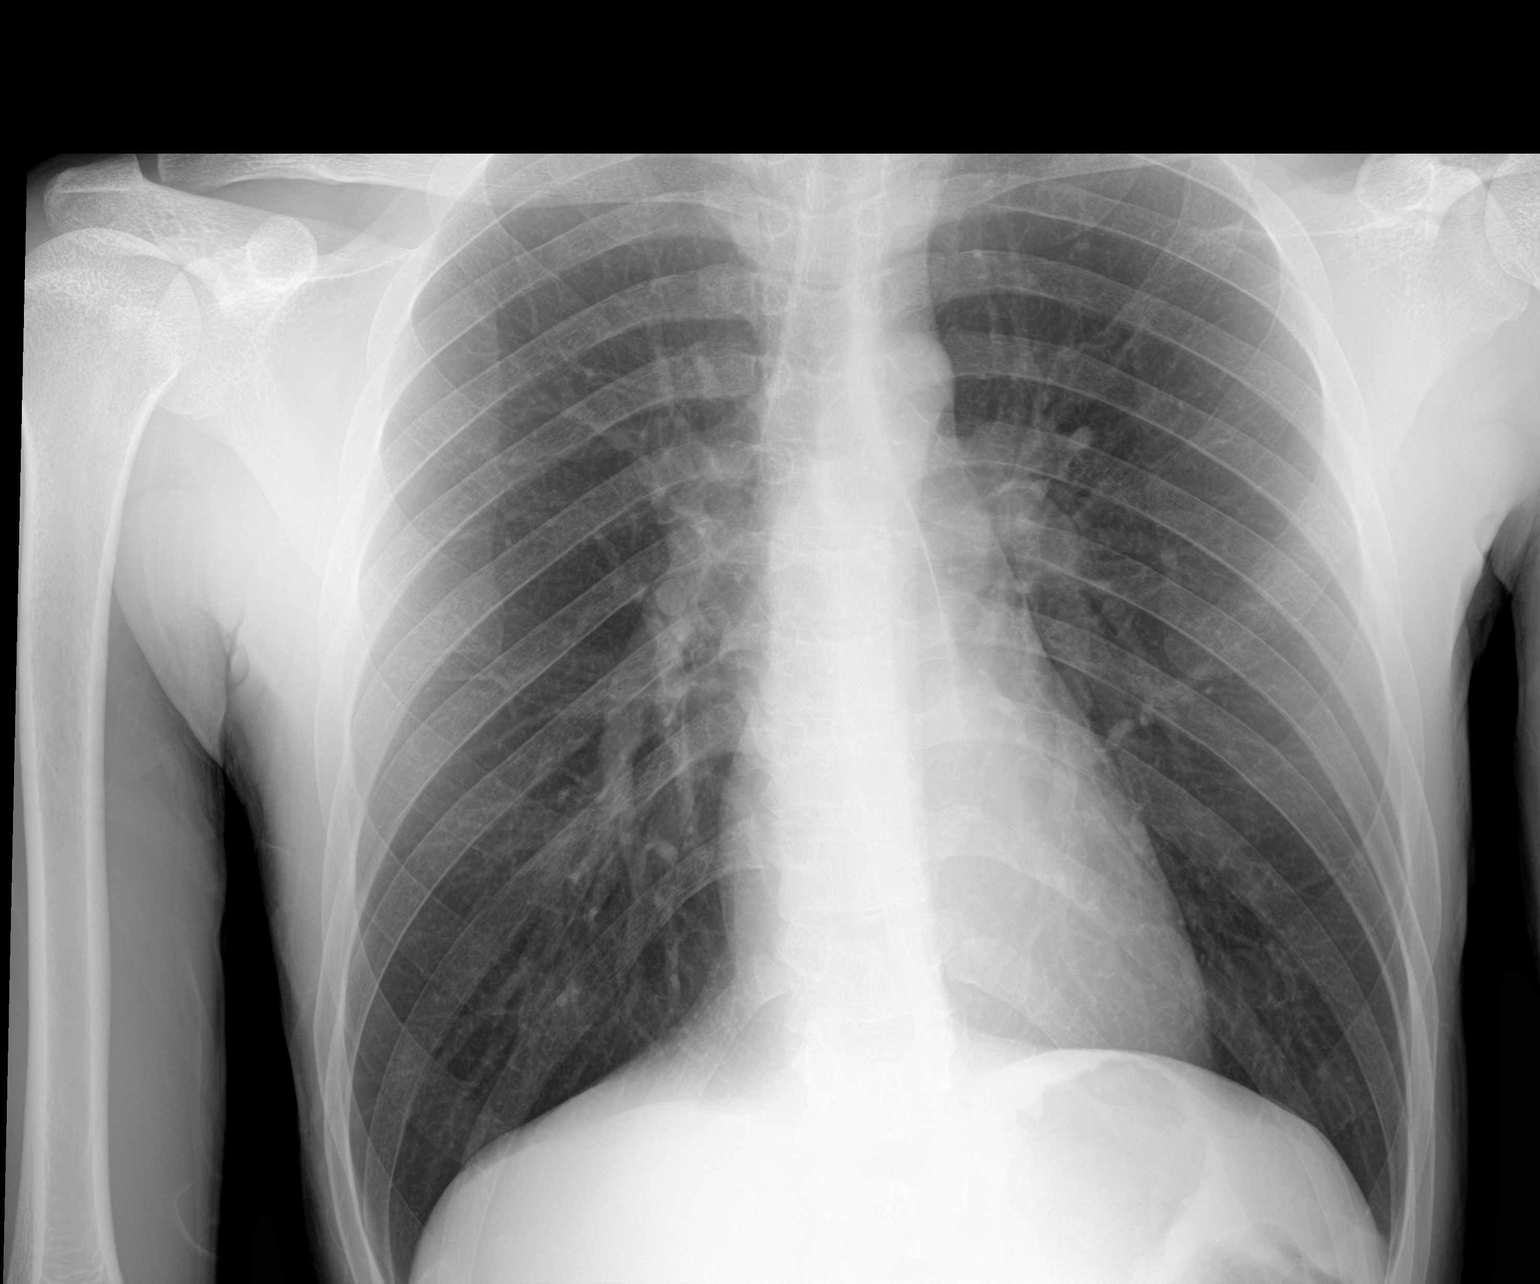

[1 of 1 positions shown; findings below may reference images not displayed]

FINDINGS: The heart size and mediastinal contours are within normal limits.
Both lungs are clear. The visualized skeletal structures are
unremarkable.
IMPRESSION: No active disease.

## 2022-01-19 ENCOUNTER — Emergency Department (HOSPITAL_COMMUNITY)
Admission: EM | Admit: 2022-01-19 | Discharge: 2022-01-20 | Disposition: A | Discharge status: Home / Self Care | Payer: Medicaid Other | Attending: Emergency Medicine | Admitting: Emergency Medicine

## 2022-01-19 ENCOUNTER — Other Ambulatory Visit: Payer: Self-pay

## 2022-01-19 ENCOUNTER — Encounter (HOSPITAL_COMMUNITY): Payer: Self-pay

## 2022-01-19 DIAGNOSIS — Z1152 Encounter for screening for COVID-19: Secondary | ICD-10-CM | POA: Insufficient documentation

## 2022-01-19 DIAGNOSIS — R197 Diarrhea, unspecified: Secondary | ICD-10-CM | POA: Insufficient documentation

## 2022-01-19 DIAGNOSIS — R112 Nausea with vomiting, unspecified: Secondary | ICD-10-CM | POA: Insufficient documentation

## 2022-01-19 LAB — URINALYSIS, ROUTINE W REFLEX MICROSCOPIC
Bilirubin Urine: NEGATIVE
Glucose, UA: NEGATIVE mg/dL
Hgb urine dipstick: NEGATIVE
Ketones, ur: 20 mg/dL — AB
Leukocytes,Ua: NEGATIVE
Nitrite: NEGATIVE
Protein, ur: NEGATIVE mg/dL
Specific Gravity, Urine: 1.026 (ref 1.005–1.030)
pH: 7 (ref 5.0–8.0)

## 2022-01-19 LAB — CBC WITH DIFFERENTIAL/PLATELET
Abs Immature Granulocytes: 0.01 10*3/uL (ref 0.00–0.07)
Basophils Absolute: 0.1 10*3/uL (ref 0.0–0.1)
Basophils Relative: 1 %
Eosinophils Absolute: 0.1 10*3/uL (ref 0.0–0.5)
Eosinophils Relative: 1 %
HCT: 46.3 % (ref 39.0–52.0)
Hemoglobin: 15.1 g/dL (ref 13.0–17.0)
Immature Granulocytes: 0 %
Lymphocytes Relative: 24 %
Lymphs Abs: 1.6 10*3/uL (ref 0.7–4.0)
MCH: 31.1 pg (ref 26.0–34.0)
MCHC: 32.6 g/dL (ref 30.0–36.0)
MCV: 95.5 fL (ref 80.0–100.0)
Monocytes Absolute: 0.5 10*3/uL (ref 0.1–1.0)
Monocytes Relative: 8 %
Neutro Abs: 4.3 10*3/uL (ref 1.7–7.7)
Neutrophils Relative %: 66 %
Platelets: 209 10*3/uL (ref 150–400)
RBC: 4.85 MIL/uL (ref 4.22–5.81)
RDW: 12.4 % (ref 11.5–15.5)
WBC: 6.5 10*3/uL (ref 4.0–10.5)
nRBC: 0 % (ref 0.0–0.2)

## 2022-01-19 LAB — COMPREHENSIVE METABOLIC PANEL
ALT: 24 U/L (ref 0–44)
AST: 23 U/L (ref 15–41)
Albumin: 4.6 g/dL (ref 3.5–5.0)
Alkaline Phosphatase: 48 U/L (ref 38–126)
Anion gap: 8 (ref 5–15)
BUN: 12 mg/dL (ref 6–20)
CO2: 25 mmol/L (ref 22–32)
Calcium: 9.1 mg/dL (ref 8.9–10.3)
Chloride: 108 mmol/L (ref 98–111)
Creatinine, Ser: 0.83 mg/dL (ref 0.61–1.24)
GFR, Estimated: >60 mL/min (ref >60)
Glucose, Bld: 107 mg/dL — ABNORMAL HIGH (ref 70–99)
Potassium: 4.3 mmol/L (ref 3.5–5.1)
Sodium: 141 mmol/L (ref 135–145)
Total Bilirubin: 0.5 mg/dL (ref 0.3–1.2)
Total Protein: 7.6 g/dL (ref 6.5–8.1)

## 2022-01-19 LAB — LIPASE, BLOOD: Lipase: 28 U/L (ref 11–51)

## 2022-01-19 LAB — RESP PANEL BY RT-PCR (FLU A&B, COVID) ARPGX2
Influenza A by PCR: NEGATIVE
Influenza B by PCR: NEGATIVE
SARS Coronavirus 2 by RT PCR: NEGATIVE

## 2022-01-19 MED ORDER — ONDANSETRON 4 MG PO TBDP: 4.0000 mg | ORAL_TABLET | Freq: Once | ORAL | Status: DC

## 2022-01-19 NOTE — ED Provider Triage Note (Signed)
Emergency Medicine Provider Triage Evaluation Note  Norman Bennett , a 26 y.o. male  was evaluated in triage.  Pt complains of vomiting since last thrusday.Has hx of GI viruses but has never had it last this long. + abd cramping. Only really vomits after trying to eat. He has been able to hold sips of gingerale but no solids. Vomited 4 times today. Has diarrhea after he eats  Review of Systems  Positive: vomiting Negative: fever  Physical Exam  BP 119/75   Pulse (!) 57   Temp 98.9 F (37.2 C) (Oral)   Resp 18   SpO2 100%  Gen:   Awake, no distress   Resp:  Normal effort  MSK:   Moves extremities without difficulty  Other:  No abd tenderness  Medical Decision Making  Medically screening exam initiated at 5:26 PM.  Appropriate orders placed.  Susette Racer was informed that the remainder of the evaluation will be completed by another provider, this initial triage assessment does not replace that evaluation, and the importance of remaining in the ED until their evaluation is complete.     Arthor Captain, PA-C 01/19/22 1729

## 2022-01-19 NOTE — ED Triage Notes (Signed)
Patient has been vomiting since Thursday along with diarrhea. No pain.

## 2022-01-20 MED ORDER — LOPERAMIDE HCL 2 MG PO CAPS: 2.0000 mg | ORAL_CAPSULE | Freq: Four times a day (QID) | ORAL | 0 refills | Status: AC | PRN

## 2022-01-20 MED ORDER — ONDANSETRON HCL 4 MG PO TABS: 4.0000 mg | ORAL_TABLET | Freq: Four times a day (QID) | ORAL | 0 refills | Status: AC

## 2022-01-20 NOTE — ED Provider Notes (Signed)
[facility]-EMERGENCY DEPT Provider Note   CSN: [id number] Arrival date & time: 01/19/22  1607     History  Chief Complaint  Patient presents with   Emesis    Norman Bennett is a 26 y.o. male.  Patient presents to the emergency department for evaluation of nausea, vomiting and diarrhea.  Symptoms began 5 days ago.  He reports that he is tolerating liquids but cannot hold down solids.  Patient reports cramping that is relieved by vomiting and diarrhea.  No persistent abdominal pain.       Home Medications Prior to Admission medications   Medication Sig Start Date End Date Taking? Authorizing Provider  loperamide (IMODIUM) 2 MG capsule Take 1 capsule (2 mg total) by mouth 4 (four) times daily as needed for diarrhea or loose stools. 01/20/22  Yes Mykeisha Dysert, Canary Brim, MD  ondansetron (ZOFRAN) 4 MG tablet Take 1 tablet (4 mg total) by mouth every 6 (six) hours. 01/20/22  Yes Alajiah Dutkiewicz, Canary Brim, MD  azithromycin (ZITHROMAX) 250 MG tablet Take 1 tablet (250 mg total) by mouth daily. Take first 2 tablets together, then 1 every day until finished. 07/10/17   Caccavale, Sophia, PA-C  loratadine (CLARITIN) 10 MG tablet Take 1 tablet (10 mg total) by mouth daily. 07/10/17   Caccavale, Sophia, PA-C      Allergies    Shellfish allergy    Review of Systems   Review of Systems  Physical Exam Updated Vital Signs BP 115/71   Pulse (!) 54   Temp 98.7 F (37.1 C) (Oral)   Resp 16   SpO2 96%  Physical Exam Vitals and nursing note reviewed.  Constitutional:      General: He is not in acute distress.    Appearance: He is well-developed.  HENT:     Head: Normocephalic and atraumatic.     Mouth/Throat:     Mouth: Mucous membranes are moist.  Eyes:     General: Vision grossly intact. Gaze aligned appropriately.     Extraocular Movements: Extraocular movements intact.     Conjunctiva/sclera: Conjunctivae normal.  Cardiovascular:     Rate and Rhythm: Normal  rate and regular rhythm.     Pulses: Normal pulses.     Heart sounds: Normal heart sounds, S1 normal and S2 normal. No murmur heard.    No friction rub. No gallop.  Pulmonary:     Effort: Pulmonary effort is normal. No respiratory distress.     Breath sounds: Normal breath sounds.  Abdominal:     Palpations: Abdomen is soft.     Tenderness: There is no abdominal tenderness. There is no guarding or rebound.     Hernia: No hernia is present.  Musculoskeletal:        General: No swelling.     Cervical back: Full passive range of motion without pain, normal range of motion and neck supple. No pain with movement, spinous process tenderness or muscular tenderness. Normal range of motion.     Right lower leg: No edema.     Left lower leg: No edema.  Skin:    General: Skin is warm and dry.     Capillary Refill: Capillary refill takes less than 2 seconds.     Findings: No ecchymosis, erythema, lesion or wound.  Neurological:     Mental Status: He is alert and oriented to person, place, and time.     GCS: GCS eye subscore is 4. GCS verbal subscore is 5. GCS motor subscore is  6.     Cranial Nerves: Cranial nerves 2-12 are intact.     Sensory: Sensation is intact.     Motor: Motor function is intact. No weakness or abnormal muscle tone.     Coordination: Coordination is intact.  Psychiatric:        Mood and Affect: Mood normal.        Speech: Speech normal.        Behavior: Behavior normal.     ED Results / Procedures / Treatments   Labs (all labs ordered are listed, but only abnormal results are displayed) Labs Reviewed  COMPREHENSIVE METABOLIC PANEL - Abnormal; Notable for the following components:      Result Value   Glucose, Bld 107 (*)    All other components within normal limits  URINALYSIS, ROUTINE W REFLEX MICROSCOPIC - Abnormal; Notable for the following components:   APPearance HAZY (*)    Ketones, ur 20 (*)    All other components within normal limits  RESP PANEL BY  RT-PCR (FLU A&B, COVID) ARPGX2  CBC WITH DIFFERENTIAL/PLATELET  LIPASE, BLOOD    EKG None  Radiology No results found.  Procedures Procedures    Medications Ordered in ED Medications  ondansetron (ZOFRAN-ODT) disintegrating tablet 4 mg (has no administration in time range)    ED Course/ Medical Decision Making/ A&P                           Medical Decision Making  Patient appears well.  Abdominal exam is benign, nontender.  No right upper quadrant tenderness, no tenderness at McBurney's point.  Vital signs are normal.  Lab work is reassuringly normal.  No clinical or laboratory signs of significant dehydration.  Combination of vomiting and diarrhea with benign abdominal exam likely indicates a viral process.  Will treat symptomatically.        Final Clinical Impression(s) / ED Diagnoses Final diagnoses:  Nausea vomiting and diarrhea    Rx / DC Orders ED Discharge Orders          Ordered    ondansetron (ZOFRAN) 4 MG tablet  Every 6 hours        01/20/22 0009    loperamide (IMODIUM) 2 MG capsule  4 times daily PRN        01/20/22 0009              Gilda Crease, MD 01/20/22 0009
